# Patient Record
Sex: Male | Born: 1995 | Race: Black or African American | Hispanic: No | Marital: Single
Health system: Southern US, Community
[De-identification: ages and names within clinical notes are randomized; demographics above are authoritative.]

---
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
## 2020-08-23 ENCOUNTER — Emergency Department (HOSPITAL_COMMUNITY): Payer: Medicaid Other

## 2020-08-23 ENCOUNTER — Emergency Department (HOSPITAL_COMMUNITY)
Admission: EM | Admit: 2020-08-23 | Discharge: 2020-08-24 | Disposition: A | Discharge status: Home / Self Care | Payer: Medicaid Other | Attending: Emergency Medicine | Admitting: Emergency Medicine

## 2020-08-23 DIAGNOSIS — Y9367 Activity, basketball: Secondary | ICD-10-CM | POA: Insufficient documentation

## 2020-08-23 DIAGNOSIS — X509XXA Other and unspecified overexertion or strenuous movements or postures, initial encounter: Secondary | ICD-10-CM | POA: Insufficient documentation

## 2020-08-23 DIAGNOSIS — M25572 Pain in left ankle and joints of left foot: Secondary | ICD-10-CM | POA: Insufficient documentation

## 2020-08-23 MED ORDER — OXYCODONE-ACETAMINOPHEN 5-325 MG PO TABS
1.0000 | ORAL_TABLET | ORAL | Status: DC | PRN
  Administered 2020-08-23: 1 via ORAL
  Filled 2020-08-23: qty 1

## 2020-08-23 NOTE — ED Triage Notes (Signed)
Pt reports was playing basketball today and reports left ankle pain. Pt states he when he came down on his ankle he heard it pop and pop it back in.

## 2020-08-24 MED ORDER — NAPROXEN 500 MG PO TABS: 500.0000 mg | ORAL_TABLET | Freq: Two times a day (BID) | ORAL | 0 refills | Status: AC

## 2020-08-24 MED ORDER — KETOROLAC TROMETHAMINE 15 MG/ML IJ SOLN
15.0000 mg | Freq: Once | INTRAMUSCULAR | Status: AC
  Administered 2020-08-24: 15 mg via INTRAMUSCULAR
  Filled 2020-08-24: qty 1

## 2020-08-24 MED ORDER — OXYCODONE-ACETAMINOPHEN 5-325 MG PO TABS
1.0000 | ORAL_TABLET | Freq: Once | ORAL | Status: AC
  Administered 2020-08-24: 1 via ORAL
  Filled 2020-08-24: qty 1

## 2020-08-24 MED ORDER — OXYCODONE-ACETAMINOPHEN 5-325 MG PO TABS: 1.0000 | ORAL_TABLET | Freq: Four times a day (QID) | ORAL | 0 refills | Status: AC | PRN

## 2020-08-24 NOTE — ED Notes (Signed)
Patient verbalized understanding of discharge instructions. Opportunity for questions and answers.  

## 2020-08-24 NOTE — ED Notes (Signed)
Ortho tech called for pts cam boot and crutches

## 2020-08-24 NOTE — Discharge Instructions (Signed)
You are suspected to have a high-grade ankle sprain and were placed in a walking boot.  However, we advise that you do not put any weight on your left leg or foot and use crutches to assist with mobility.  You may temporarily remove your walking boot only to shower/bathe. Call the office of Dr. Roda Shutters in the morning to schedule a follow-up appointment with orthopedics.  Ice your ankle 3-4 times per day for 15 to 20 minutes each time.  Take naproxen as prescribed for pain.  You may take Percocet for severe pain.  This is a narcotic medication which has potential for risks of dependency.  Take only when indicated.  Do not drive or drink alcohol after taking this medication as it may make you drowsy and impair your judgment.  Return to the ED for new or concerning symptoms.

## 2020-08-24 NOTE — ED Provider Notes (Signed)
[facility] EMERGENCY DEPARTMENT Provider Note   CSN: [id number] Arrival date & time: 08/23/20  1841     History Chief Complaint  Patient presents with  . Ankle Pain    Craig Jenkins is a 25 y.o. male.  25 year old reports that he stepped on another player's foot while playing basketball causing his ankle to roll.  He has been experiencing constant pain.  Received Percocet in triage with some improvement.  No medications taken prior to arrival.  Felt as though his ankle was "out of place" which he corrected on his own.  Heard a "pop" at time of injury.  The history is provided by the patient. No language interpreter was used.  Ankle Pain Location:  Ankle Time since incident:  7 hours Injury: yes   Mechanism of injury comment:  Rolled while playing basketball Ankle location:  L ankle Pain details:    Radiates to:  Does not radiate   Severity:  Moderate   Onset quality:  Sudden   Duration:  8 hours   Timing:  Constant   Progression:  Unchanged Chronicity:  New Prior injury to area:  No Relieved by: Percocet given in triage. Exacerbated by: attempted ambulation; palpation. Ineffective treatments:  Rest Associated symptoms: decreased ROM and swelling   Associated symptoms: no numbness   Risk factors: no known bone disorder        No past medical history on file.  There are no problems to display for this patient.   ** The histories are not reviewed yet. Please review them in the "History" navigator section and refresh this SmartLink.     No family history on file.     Home Medications Prior to Admission medications   Medication Sig Start Date End Date Taking? Authorizing Provider  naproxen (NAPROSYN) 500 MG tablet Take 1 tablet (500 mg total) by mouth 2 (two) times daily with a meal. 08/24/20  Yes Antony Madura, PA-C  oxyCODONE-acetaminophen (PERCOCET/ROXICET) 5-325 MG tablet Take 1 tablet by mouth every 6 (six) hours as needed for severe pain.  08/24/20  Yes Antony Madura, PA-C    Allergies    Patient has no known allergies.  Review of Systems   Review of Systems  Ten systems reviewed and are negative for acute change, except as noted in the HPI.    Physical Exam Updated Vital Signs BP 115/80 (BP Location: Left Arm)   Pulse 80   Temp 97.8 F (36.6 C) (Oral)   Resp 18   SpO2 99%   Physical Exam Vitals and nursing note reviewed.  Constitutional:      General: He is not in acute distress.    Appearance: He is well-developed. He is not diaphoretic.     Comments: Nontoxic appearing and in NAD  HENT:     Head: Normocephalic and atraumatic.  Eyes:     General: No scleral icterus.    Conjunctiva/sclera: Conjunctivae normal.  Cardiovascular:     Rate and Rhythm: Normal rate and regular rhythm.     Pulses: Normal pulses.     Comments: DP pulse 2+ in the left lower extremity Pulmonary:     Effort: Pulmonary effort is normal. No respiratory distress.     Comments: Respirations even and unlabored Musculoskeletal:     Cervical back: Normal range of motion.     Comments: Decreased range of motion of the left ankle secondary to pain.  There is diffuse edema with scattered bruising to the left ankle.  No crepitus  or appreciable deformity.  Negative Thompson's test.  Skin:    General: Skin is warm and dry.     Coloration: Skin is not pale.     Findings: No erythema or rash.  Neurological:     Mental Status: He is alert and oriented to person, place, and time.     Coordination: Coordination normal.     Comments: Sensation to light touch intact in the left lower extremity.  Patient able to wiggle all toes.  Psychiatric:        Behavior: Behavior normal.     ED Results / Procedures / Treatments   Labs (all labs ordered are listed, but only abnormal results are displayed) Labs Reviewed - No data to display  EKG None  Radiology DG Tibia/Fibula Left  Result Date: 08/23/2020 CLINICAL DATA:  Fall EXAM: LEFT TIBIA AND  FIBULA - 2 VIEW COMPARISON:  None. FINDINGS: There is no evidence of fracture or other focal bone lesions. Soft tissues are unremarkable. IMPRESSION: Negative. Electronically Signed   By: Jonna Clark M.D.   On: 08/23/2020 20:01   DG Ankle Complete Left  Result Date: 08/23/2020 CLINICAL DATA:  Fall pain EXAM: LEFT ANKLE COMPLETE - 3+ VIEW COMPARISON:  None. FINDINGS: A tiny nondisplaced ossific fragment seen on the dorsal surface of the anterior talus. There is overlying soft tissue swelling seen around the lateral malleolus. IMPRESSION: Mildly displaced chip fracture seen on the dorsal surface of the anterior talus. Electronically Signed   By: Jonna Clark M.D.   On: 08/23/2020 19:55    Procedures Procedures   Medications Ordered in ED Medications  ketorolac (TORADOL) 15 MG/ML injection 15 mg (15 mg Intramuscular Given 08/24/20 0042)  oxyCODONE-acetaminophen (PERCOCET/ROXICET) 5-325 MG per tablet 1 tablet (1 tablet Oral Given 08/24/20 0042)    ED Course  I have reviewed the triage vital signs and the nursing notes.  Pertinent labs & imaging results that were available during my care of the patient were reviewed by me and considered in my medical decision making (see chart for details).    MDM Rules/Calculators/A&P                          25 year old presents for left ankle pain.  Concern for high grade ankle sprain.  Possible dislocation which was reduced prior to arrival by the patient.  He has appropriate alignment on x-ray without significant fracture.  There is a chip on the dorsal surface of the anterior talus likely corresponding with ligament injury.  He is neurovascularly intact.  Was placed in a cam Grimm and instructed to remain nonweightbearing.  Will refer to orthopedics for follow-up.  Return precautions discussed and provided. Patient discharged in stable condition with no unaddressed concerns.   Final Clinical Impression(s) / ED Diagnoses Final diagnoses:  Acute left  ankle pain    Rx / DC Orders ED Discharge Orders         Ordered    oxyCODONE-acetaminophen (PERCOCET/ROXICET) 5-325 MG tablet  Every 6 hours PRN        08/24/20 0032    naproxen (NAPROSYN) 500 MG tablet  2 times daily with meals        08/24/20 0032           Antony Madura, PA-C 08/24/20 0103    Palumbo, April, MD 08/24/20 2353

## 2020-08-24 NOTE — Progress Notes (Signed)
Orthopedic Tech Progress Note Patient Details:  Craig Jenkins 10-Feb-1996 [id number]  Ortho Devices Type of Ortho Device: Crutches,CAM [place] Ortho Device/Splint Location: Left Lower Extremity Ortho Device/Splint Interventions: Ordered,Application,Adjustment   Post Interventions Patient Tolerated: Well Instructions Provided: Adjustment of device,Care of device,Poper ambulation with device   Craig Jenkins 08/24/2020, 1:07 AM

## 2020-08-30 ENCOUNTER — Ambulatory Visit: Payer: Self-pay | Admitting: Orthopaedic Surgery

## 2020-08-30 VITALS — Ht 64.0 in | Wt 163.0 lb

## 2020-08-30 DIAGNOSIS — S93402A Sprain of unspecified ligament of left ankle, initial encounter: Secondary | ICD-10-CM

## 2020-08-30 MED ORDER — MUPIROCIN 2 % EX OINT: 1.0000 "application " | TOPICAL_OINTMENT | Freq: Two times a day (BID) | CUTANEOUS | 0 refills | Status: AC

## 2020-08-30 MED ORDER — CEPHALEXIN 500 MG PO CAPS: 500.0000 mg | ORAL_CAPSULE | Freq: Four times a day (QID) | ORAL | 0 refills | Status: AC

## 2020-08-30 NOTE — Progress Notes (Signed)
Office Visit Note   Patient: Craig Jenkins           Date of Birth: 02-Aug-1995           MRN: [id number] Visit Date: 08/30/2020              Requested by: No referring provider defined for this encounter. PCP: Patient, No Pcp Per   Assessment & Plan: Visit Diagnoses:  1. Sprain of left ankle, unspecified ligament, initial encounter     Plan: Impression is left ankle grade 2 ATFL sprain, avulsion fracture of the talus as well as new and mild cellulitis.  At this point, I recommended applying mupirocin to the abrasion twice daily which she will cover with a Band-Aid.  He will continue to weight-bear in a Cam [place].  He will apply ice and elevate as much as possible.  Follow-up with [place] in 1 week's time for wound check.  Call with concerns or questions in the meantime.  Follow-Up Instructions: Return in about 1 week (around 09/06/2020).   Orders:  No orders of the defined types were placed in this encounter.  Meds ordered this encounter  Medications  . cephALEXin (KEFLEX) 500 MG capsule    Sig: Take 1 capsule (500 mg total) by mouth 4 (four) times daily.    Dispense:  28 capsule    Refill:  0  . mupirocin ointment (BACTROBAN) 2 %    Sig: Apply 1 application topically 2 (two) times daily.    Dispense:  22 g    Refill:  0      Procedures: No procedures performed   Clinical Data: No additional findings.   Subjective: Chief Complaint  Patient presents with  . Left Ankle - Pain    HPI patient is a pleasant 25 year old gentleman who comes in today for evaluation of left ankle injury.  On 08/23/2020, he was playing basketball when he came down inverting his left ankle onto another player's foot.  He notes that this was dislocated but reduced himself.  He was then seen in the ED where x-rays were obtained.  X-rays demonstrate a small avulsion fracture to the anterior talus.  He was placed in a cam [place].  He comes in today for further evaluation and treat recommendation.  The  pain is throughout the entire foot and lateral ankle.  Worse with bearing weight and with plantar flexion.  He has been taking Tylenol and ibuprofen as well as Percocet if his pain becomes extreme.  He denies any fevers or chills.  Review of Systems as detailed in HPI.  All others reviewed and are negative.   Objective: Vital Signs: Ht 5\' 4"  (1.626 m)   Wt 163 lb (73.9 kg)   BMI 27.98 kg/m   Physical Exam well-developed well-nourished gentleman no acute distress.  Alert oriented x3.  Ortho Exam left foot ankle shows moderate swelling throughout.  Moderate erythema to the dorsum of the foot.  He does have a superficial abrasion without any drainage.  No induration.  Mild warmth.  Increased pain to the ATFL.  Increased pain with plantar flexion, inversion and eversion of the ankle.  He is neurovascular intact distally.  Specialty Comments:  No specialty comments available.  Imaging: No new imaging   PMFS History: There are no problems to display for this patient.  No past medical history on file.  No family history on file.   Social History   Occupational History  . Not on file  Tobacco Use  .  Smoking status: Not on file  . Smokeless tobacco: Not on file  Substance and Sexual Activity  . Alcohol use: Not on file  . Drug use: Not on file  . Sexual activity: Not on file

## 2020-09-06 ENCOUNTER — Ambulatory Visit (INDEPENDENT_AMBULATORY_CARE_PROVIDER_SITE_OTHER): Payer: Self-pay | Admitting: Orthopaedic Surgery

## 2020-09-06 VITALS — Ht 64.0 in | Wt 163.0 lb

## 2020-09-06 DIAGNOSIS — S93402A Sprain of unspecified ligament of left ankle, initial encounter: Secondary | ICD-10-CM

## 2020-09-06 NOTE — Progress Notes (Signed)
   Office Visit Note   Patient: Craig Jenkins           Date of Birth: 1996/01/16           MRN: [id number] Visit Date: 09/06/2020              Requested by: No referring provider defined for this encounter. PCP: Patient, No Pcp Per (Inactive)   Assessment & Plan: Visit Diagnoses:  1. Sprain of left ankle, unspecified ligament, initial encounter     Plan: Impression is left ankle grade 2 sprain, avulsion fracture to the talus and resolved cellulitis to the left foot/ankle.  At this point, I am pleased with how his ankle looks.  We will go ahead and provide him with an ASO to transition into when he feels comfortable.  He will follow up with [place] in 3 weeks time for recheck.  Call with concerns or questions.  Follow-Up Instructions: Return in about 3 weeks (around 09/27/2020).   Orders:  No orders of the defined types were placed in this encounter.  No orders of the defined types were placed in this encounter.     Procedures: No procedures performed   Clinical Data: No additional findings.   Subjective: Chief Complaint  Patient presents with  . Left Ankle - Follow-up    Left ankle sprain and avulsion fracture    HPI pleasant 25 year old who comes in today for follow-up of his left ankle injury.  He has approximately 2 weeks out left grade 2 ankle sprain and avulsion fracture of the talus.  He also sustained a superficial abrasion for which she developed early cellulitis as seen at his appointment last week.  He was started on Keflex for which he finished half of due to aggravation of his GI system.  He notes that his pain has significantly improved.  No fevers or chills.  Review of Systems as detailed in HPI.  All others reviewed and are negative.   Objective: Vital Signs: Ht 5\' 4"  (1.626 m)   Wt 163 lb (73.9 kg)   BMI 27.98 kg/m   Physical Exam well-developed well-nourished gentleman in no acute distress.  Alert oriented x3.  Ortho Exam left ankle exam shows a  scabbed over superficial abrasion.  He has mild swelling to the ankle.  No erythema or induration.  He still has tenderness to the ATFL.  He is neurovascularly intact distally.  Specialty Comments:  No specialty comments available.  Imaging: No new imaging   PMFS History: There are no problems to display for this patient.  No past medical history on file.  No family history on file.   Social History   Occupational History  . Not on file  Tobacco Use  . Smoking status: Not on file  . Smokeless tobacco: Not on file  Substance and Sexual Activity  . Alcohol use: Not on file  . Drug use: Not on file  . Sexual activity: Not on file

## 2020-09-08 ENCOUNTER — Encounter: Payer: Self-pay | Admitting: Orthopaedic Surgery

## 2020-09-08 NOTE — Telephone Encounter (Signed)
Yes that's fine.  We can write a note giving him approval . Thanks.

## 2020-09-27 ENCOUNTER — Ambulatory Visit (INDEPENDENT_AMBULATORY_CARE_PROVIDER_SITE_OTHER): Payer: Self-pay | Admitting: Orthopaedic Surgery

## 2020-09-27 ENCOUNTER — Telehealth: Payer: Self-pay

## 2020-09-27 VITALS — Ht 64.0 in | Wt 163.0 lb

## 2020-09-27 DIAGNOSIS — S93402A Sprain of unspecified ligament of left ankle, initial encounter: Secondary | ICD-10-CM

## 2020-09-27 NOTE — Telephone Encounter (Signed)
Dr. Xu pt 

## 2020-09-27 NOTE — Telephone Encounter (Signed)
Patient was seen in the office today he is requesting a medical clearance note to be emailed to him to the email on file call back:[phone]

## 2020-09-27 NOTE — Progress Notes (Signed)
   Office Visit Note   Patient: Craig Jenkins           Date of Birth: February 21, 1996           MRN: [id number] Visit Date: 09/27/2020              Requested by: No referring provider defined for this encounter. PCP: Patient, No Pcp Per (Inactive)   Assessment & Plan: Visit Diagnoses:  1. Sprain of left ankle, unspecified ligament, initial encounter     Plan: At this point patient has demonstrated continued healing and improvement.  He will continue use ASO brace as needed.  Increase activity as tolerated.  Follow-up as needed.  Follow-Up Instructions: Return if symptoms worsen or fail to improve.   Orders:  No orders of the defined types were placed in this encounter.  No orders of the defined types were placed in this encounter.     Procedures: No procedures performed   Clinical Data: No additional findings.   Subjective: Chief Complaint  Patient presents with  . Left Ankle - Follow-up    Ankle sprain    Patient returns today approximately 4 weeks status post left ankle sprain.  He is overall doing well has no complaints.  He has some residual mild swelling that does not affect his daily activities.   Review of Systems  Constitutional: Negative.   All other systems reviewed and are negative.    Objective: Vital Signs: Ht 5\' 4"  (1.626 m)   Wt 163 lb (73.9 kg)   BMI 27.98 kg/m   Physical Exam Vitals and nursing note reviewed.  Constitutional:      Appearance: He is well-developed.  Pulmonary:     Effort: Pulmonary effort is normal.  Abdominal:     Palpations: Abdomen is soft.  Skin:    General: Skin is warm.  Neurological:     Mental Status: He is alert and oriented to person, place, and time.  Psychiatric:        Behavior: Behavior normal.        Thought Content: Thought content normal.        Judgment: Judgment normal.     Ortho Exam Left ankle shows residual mild swelling.  The abrasions have fully healed.  He has no instability of the ankle  to stress testing.  He has essentially full range of motion without pain.  Excellent strength and sensation. Specialty Comments:  No specialty comments available.  Imaging: No results found.   PMFS History: There are no problems to display for this patient.  No past medical history on file.  No family history on file.   Social History   Occupational History  . Not on file  Tobacco Use  . Smoking status: Not on file  . Smokeless tobacco: Not on file  Substance and Sexual Activity  . Alcohol use: Not on file  . Drug use: Not on file  . Sexual activity: Not on file

## 2020-09-28 NOTE — Telephone Encounter (Signed)
Yes that is fine.  Thank you.

## 2020-09-30 ENCOUNTER — Encounter: Payer: Self-pay | Admitting: Orthopaedic Surgery

## 2020-09-30 NOTE — Telephone Encounter (Signed)
Need to know what exactly he needs? Called patient, no answer.

## 2021-10-11 IMAGING — CR DG TIBIA/FIBULA 2V*L*
4 series · 4 of 4 positions shown · non-contrast
Comparison: None.

CLINICAL DATA: Fall

EXAM:
LEFT TIBIA AND FIBULA - 2 VIEW

[tibia ap (1 of 2)]
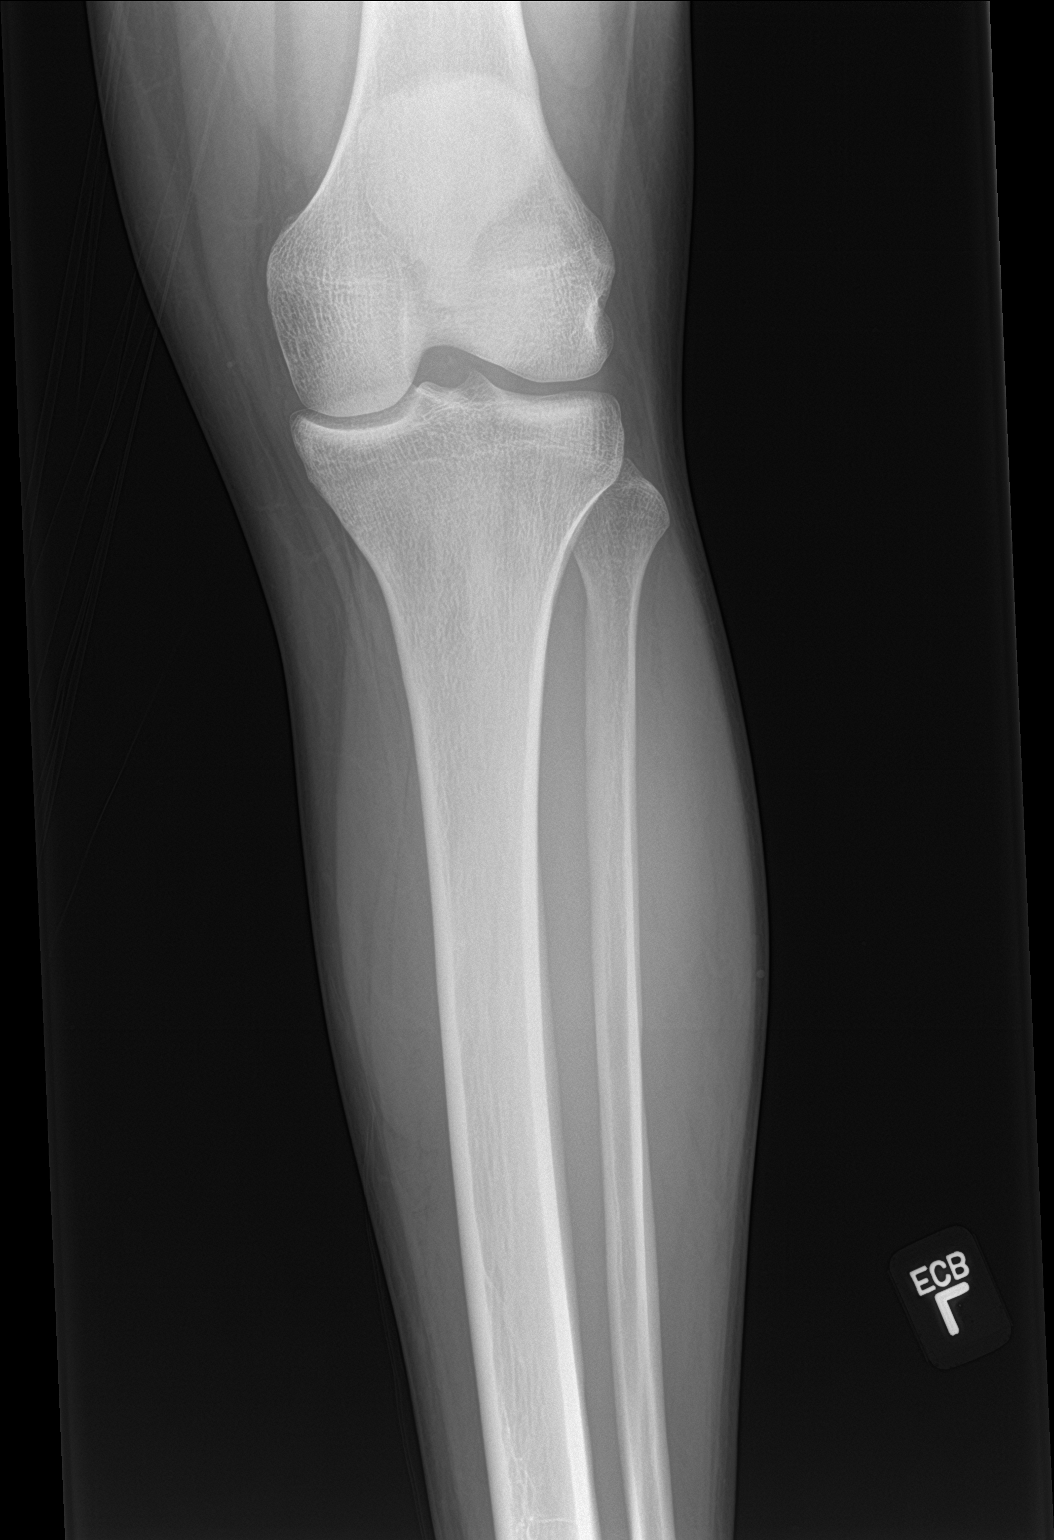

[tibia ap (2 of 2)]
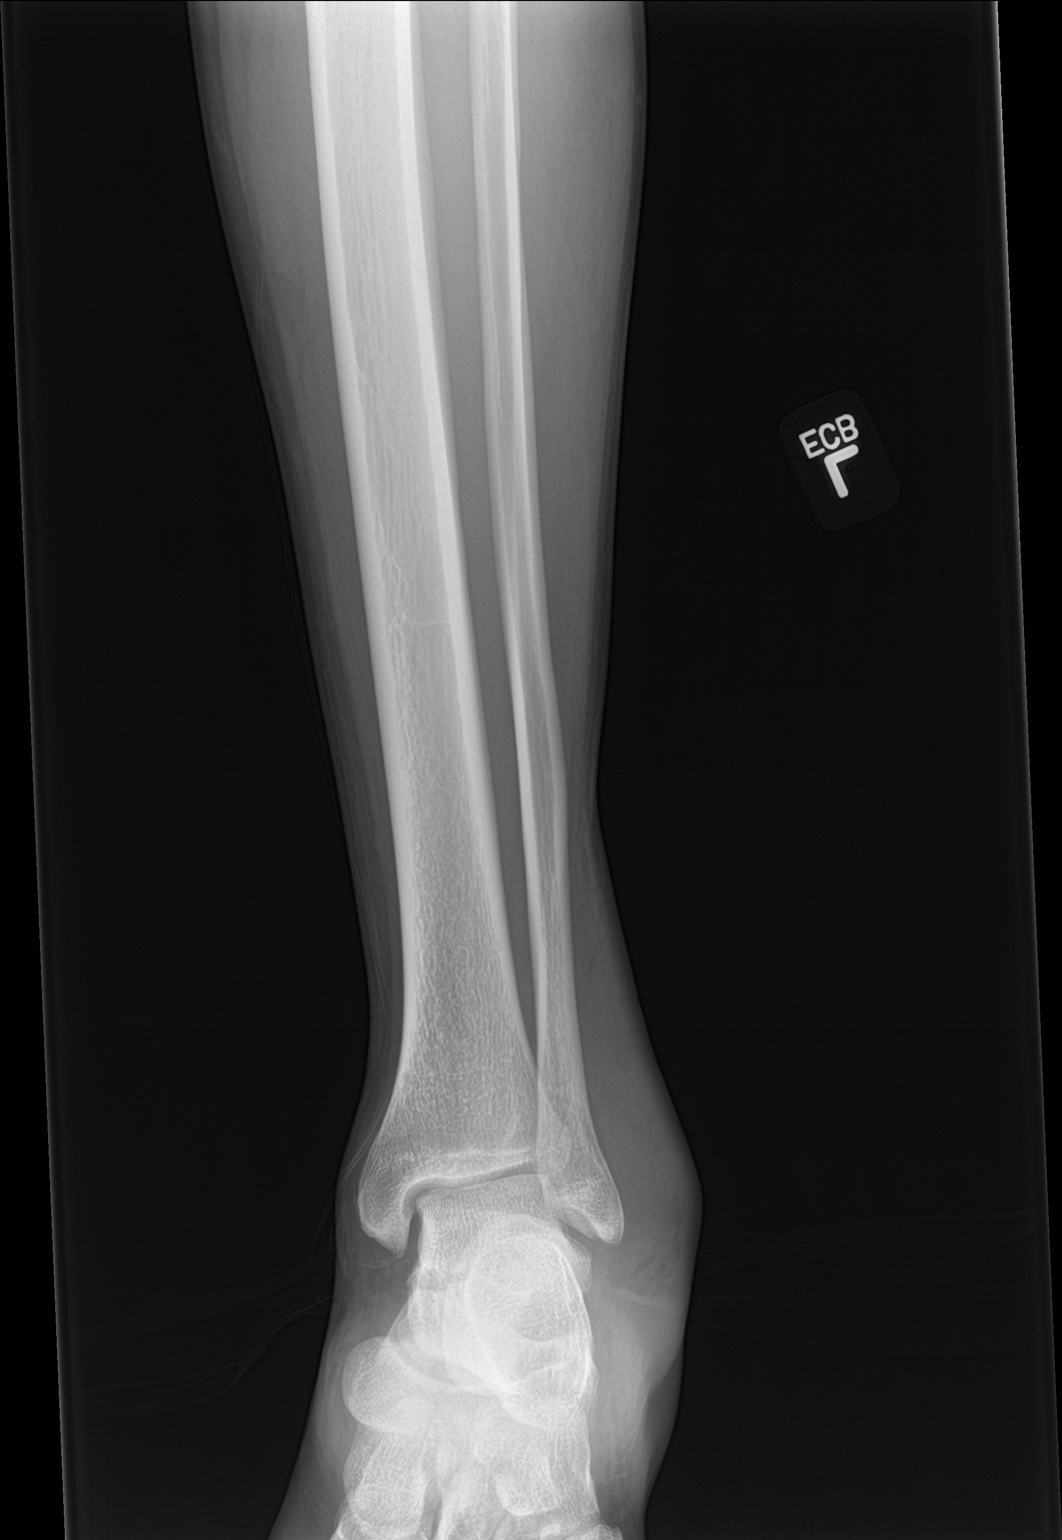

[tibia lat (1 of 2)]
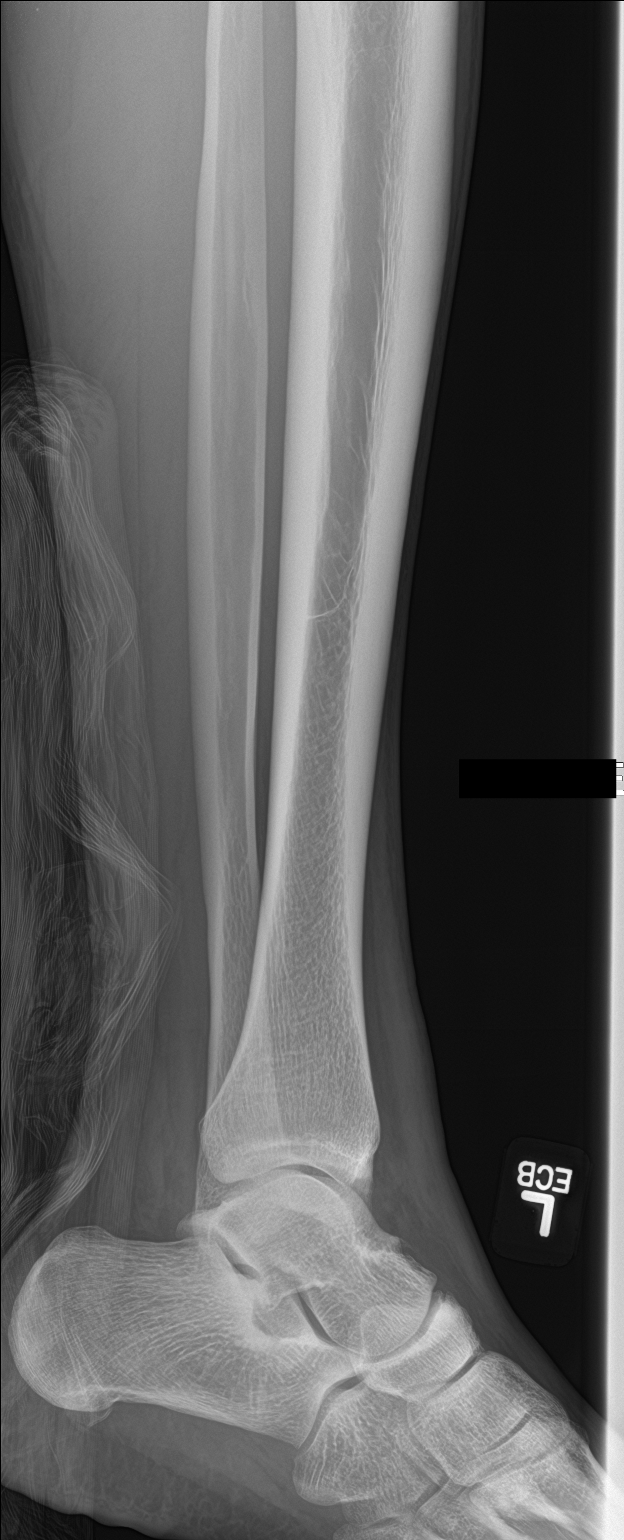

[tibia lat (2 of 2)]
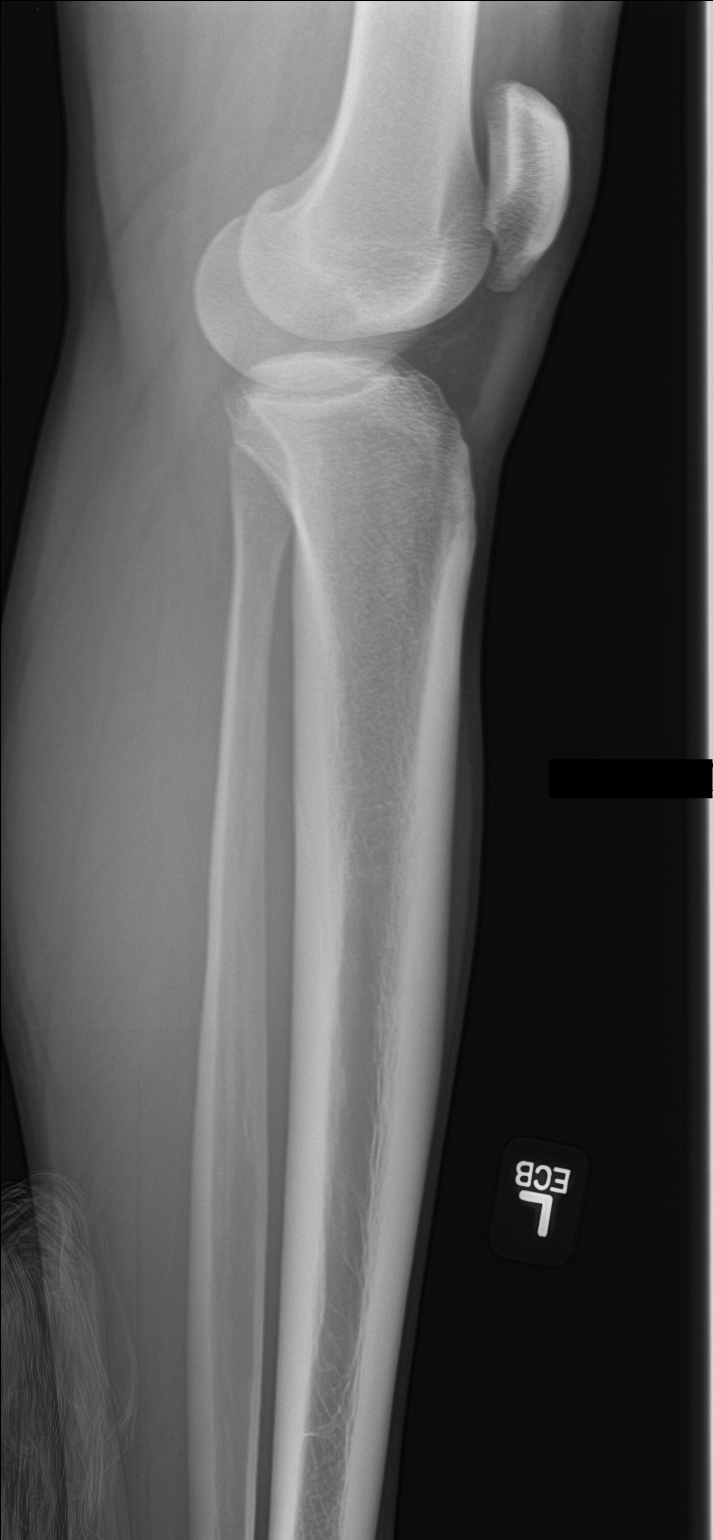

[4 of 4 positions shown; findings below may reference images not displayed]

FINDINGS: There is no evidence of fracture or other focal bone lesions. Soft
tissues are unremarkable.
IMPRESSION: Negative.
